# Patient Record
Sex: Male | Born: 1990 | Race: Black or African American | Hispanic: No | Marital: Single | Smoking: Current some day smoker
Health system: Southern US, Community
[De-identification: ages and names within clinical notes are randomized; demographics above are authoritative.]

## PROBLEM LIST (undated history)

## (undated) DIAGNOSIS — S2239XA Fracture of one rib, unspecified side, initial encounter for closed fracture: Secondary | ICD-10-CM

## (undated) DIAGNOSIS — S62609A Fracture of unspecified phalanx of unspecified finger, initial encounter for closed fracture: Secondary | ICD-10-CM

## (undated) DIAGNOSIS — G4700 Insomnia, unspecified: Secondary | ICD-10-CM

## (undated) DIAGNOSIS — S8290XA Unspecified fracture of unspecified lower leg, initial encounter for closed fracture: Secondary | ICD-10-CM

## (undated) HISTORY — PX: FRACTURE SURGERY: SHX138

---

## 2010-03-08 ENCOUNTER — Emergency Department (HOSPITAL_COMMUNITY)
Admission: EM | Admit: 2010-03-08 | Discharge: 2010-03-08 | Payer: Self-pay | Source: Home / Self Care | Admitting: Emergency Medicine

## 2011-06-18 ENCOUNTER — Emergency Department (HOSPITAL_COMMUNITY)
Admission: EM | Admit: 2011-06-18 | Discharge: 2011-06-18 | Disposition: A | Discharge status: Home / Self Care | Payer: Self-pay | Attending: Emergency Medicine | Admitting: Emergency Medicine

## 2011-06-18 DIAGNOSIS — IMO0002 Reserved for concepts with insufficient information to code with codable children: Secondary | ICD-10-CM | POA: Insufficient documentation

## 2011-09-15 ENCOUNTER — Emergency Department (HOSPITAL_COMMUNITY)
Admission: EM | Admit: 2011-09-15 | Discharge: 2011-09-15 | Disposition: A | Discharge status: Home / Self Care | Payer: Self-pay | Attending: Emergency Medicine | Admitting: Emergency Medicine

## 2011-09-15 DIAGNOSIS — R599 Enlarged lymph nodes, unspecified: Secondary | ICD-10-CM | POA: Insufficient documentation

## 2011-09-15 DIAGNOSIS — L988 Other specified disorders of the skin and subcutaneous tissue: Secondary | ICD-10-CM | POA: Insufficient documentation

## 2011-09-15 DIAGNOSIS — R238 Other skin changes: Secondary | ICD-10-CM

## 2011-09-15 MED ORDER — ACYCLOVIR 5 % EX OINT: TOPICAL_OINTMENT | CUTANEOUS | Status: AC

## 2011-09-15 MED ORDER — LIDOCAINE HCL 2 % IJ SOLN
INTRAMUSCULAR | Status: AC
  Filled 2011-09-15: qty 1

## 2011-09-15 MED ORDER — ACYCLOVIR 5 % EX OINT: TOPICAL_OINTMENT | CUTANEOUS | Status: DC

## 2011-09-15 NOTE — ED Provider Notes (Signed)
History     CSN: [id number] Arrival date & time: 09/15/2011  8:49 AM   First MD Initiated Contact with Patient 09/15/11 1029      Chief Complaint  Patient presents with  . Abscess    (Consider location/radiation/quality/duration/timing/severity/associated sxs/prior treatment) HPI Comments: Patient first noticed a rash on his buttocks yesterday.  He reports that he has never had anything like this before.  The rash does not itch or burn.  The rash is tender to palpation.  No history of STD's.  Patient is sexually active, but reports that he always wears a condom.  He denies any anal intercourse.  He is also having some tenderness and has noticed a bump in the right inguinal region.  Patient is a 20 y.o. male presenting with rash. The history is provided by the patient.  Rash  This is a new problem. The current episode started yesterday. The problem has been gradually worsening. The problem is associated with nothing. There has been no fever. The rash is present on the right buttock. He has tried nothing for the symptoms.    History reviewed. No pertinent past medical history.  History reviewed. No pertinent past surgical history.  History reviewed. No pertinent family history.  History  Substance Use Topics  . Smoking status: Current Everyday Smoker  . Smokeless tobacco: Not on file  . Alcohol Use: Yes      Review of Systems  Constitutional: Negative for fever, chills and fatigue.  Gastrointestinal: Negative for nausea, vomiting, abdominal pain, diarrhea and constipation.  Genitourinary: Negative for dysuria, hematuria, decreased urine volume, discharge, penile swelling, scrotal swelling, difficulty urinating, genital sores, penile pain and testicular pain.  Skin: Positive for rash.  Hematological: Positive for adenopathy.    Allergies  Review of patient's allergies indicates no known allergies.  Home Medications  No current outpatient prescriptions on file.  BP  121/69  Pulse 55  Temp(Src) 98.6 F (37 C) (Oral)  Resp 18  SpO2 99%  Physical Exam  Nursing note and vitals reviewed. Constitutional: He appears well-developed and well-nourished.  HENT:  Head: Normocephalic and atraumatic.  Neck: Normal range of motion. Neck supple.  Cardiovascular: Normal rate, regular rhythm and normal heart sounds.   Pulmonary/Chest: Effort normal and breath sounds normal.  Abdominal: Soft. There is no tenderness.  Genitourinary: Testes normal and penis normal. Circumcised. No penile erythema or penile tenderness. No discharge found.       Erythematous vesicular rash on right buttock and the gluteal cleft  Lymphadenopathy:       Right: Inguinal adenopathy present.    ED Course  Procedures (including critical care time)  Labs Reviewed - No data to display No results found.   1. Rash, vesicular    Patient discussed with Dr. Juleen China who also evaluated patient.    MDM  Erythematous Vesicular rash consistent in appearance with Herpes.  However, location of rash unusual for herpes.  Will discharge patient with prescription for Acyclovir and encourage PCP follow up.        Pascal Lux Wingen 09/16/11 0100

## 2011-09-15 NOTE — ED Notes (Signed)
Pt in with several abscess to the rectum area states noted this am states painful when sitting denies drainage from the area

## 2011-09-21 NOTE — ED Provider Notes (Signed)
Medical screening examination/treatment/procedure(s) were performed by non-physician practitioner and as supervising physician I was immediately available for consultation/collaboration.  Mashelle Busick, MD 09/21/11 0524 

## 2014-01-24 ENCOUNTER — Encounter (HOSPITAL_COMMUNITY): Payer: Self-pay | Admitting: Emergency Medicine

## 2014-01-24 ENCOUNTER — Emergency Department (HOSPITAL_COMMUNITY): Payer: Self-pay

## 2014-01-24 ENCOUNTER — Emergency Department (HOSPITAL_COMMUNITY)
Admission: EM | Admit: 2014-01-24 | Discharge: 2014-01-24 | Disposition: A | Discharge status: Home / Self Care | Payer: Self-pay | Attending: Emergency Medicine | Admitting: Emergency Medicine

## 2014-01-24 DIAGNOSIS — F17200 Nicotine dependence, unspecified, uncomplicated: Secondary | ICD-10-CM | POA: Insufficient documentation

## 2014-01-24 DIAGNOSIS — M25579 Pain in unspecified ankle and joints of unspecified foot: Secondary | ICD-10-CM | POA: Insufficient documentation

## 2014-01-24 DIAGNOSIS — Z8781 Personal history of (healed) traumatic fracture: Secondary | ICD-10-CM | POA: Insufficient documentation

## 2014-01-24 DIAGNOSIS — M79671 Pain in right foot: Secondary | ICD-10-CM

## 2014-01-24 HISTORY — DX: Fracture of unspecified phalanx of unspecified finger, initial encounter for closed fracture: S62.609A

## 2014-01-24 HISTORY — DX: Fracture of one rib, unspecified side, initial encounter for closed fracture: S22.39XA

## 2014-01-24 HISTORY — DX: Unspecified fracture of unspecified lower leg, initial encounter for closed fracture: S82.90XA

## 2014-01-24 NOTE — ED Notes (Signed)
Patient c/o right foot pain that he noted this AM. Patient states he is unable to bear weight on his right foot. Patient denies any injuries.

## 2014-01-24 NOTE — ED Provider Notes (Signed)
CSN: [id number]     Arrival date & time 01/24/14  1204 History   First MD Initiated Contact with Patient 01/24/14 1257     Chief Complaint  Patient presents with  . Foot Pain     (Consider location/radiation/quality/duration/timing/severity/associated sxs/prior Treatment) Patient is a 23 y.o. male presenting with lower extremity pain. The history is provided by the patient and medical records.  Foot Pain Associated symptoms include arthralgias.   This is a 23 year old male with no significant past medical history presenting to the ED for right foot pain. Patient states he played basketball over the weekend but does not remember specific injury. States this morning he was able to bear weight but it was very painful so he was limping a little.  Denies numbness or paresthesias of foot. No prior right foot or ankle injuries.  Past Medical History  Diagnosis Date  . Rib fracture   . Finger fracture   . Leg fracture    No past surgical history on file. History reviewed. No pertinent family history. History  Substance Use Topics  . Smoking status: Current Some Day Smoker    Types: Cigarettes, Cigars  . Smokeless tobacco: Not on file  . Alcohol Use: Yes     Comment: weekends only    Review of Systems  Musculoskeletal: Positive for arthralgias.  All other systems reviewed and are negative.     Allergies  Review of patient's allergies indicates no known allergies.  Home Medications   Current Outpatient Rx  Name  Route  Sig  Dispense  Refill  . aspirin-acetaminophen-caffeine (EXCEDRIN MIGRAINE) 250-250-65 MG per tablet   Oral   Take 1 tablet by mouth every 6 (six) hours as needed for headache.          BP 122/60  Pulse 64  Temp(Src) 97.6 F (36.4 C) (Oral)  Resp 16  SpO2 99%  Physical Exam  Nursing note and vitals reviewed. Constitutional: He is oriented to person, place, and time. He appears well-developed and well-nourished. No distress.  HENT:  Head:  Normocephalic and atraumatic.  Mouth/Throat: Oropharynx is clear and moist.  Eyes: Conjunctivae and EOM are normal. Pupils are equal, round, and reactive to light.  Neck: Normal range of motion.  Cardiovascular: Normal rate, regular rhythm and normal heart sounds.   Pulmonary/Chest: Effort normal and breath sounds normal. No respiratory distress. He has no wheezes.  Musculoskeletal: Normal range of motion.       Right foot: He exhibits tenderness. He exhibits no bony tenderness, no crepitus and no deformity.       Feet:  Pain along the lateral aspect of right foot, no swelling or gross deformities present; strong DP pulse and cap refill; sensation intact; moving all toes appropriately  Neurological: He is alert and oriented to person, place, and time.  Skin: Skin is warm. He is not diaphoretic.  Psychiatric: He has a normal mood and affect.    ED Course  ORTHOPEDIC INJURY TREATMENT Date/Time: 01/24/2014 2:41 PM Performed by: Garlon HatchetSANDERS, Aarica Wax M Authorized by: Garlon HatchetSANDERS, Keoni Risinger M Consent: Verbal consent obtained. Risks and benefits: risks, benefits and alternatives were discussed Consent given by: patient Patient understanding: patient states understanding of the procedure being performed Patient identity confirmed: verbally with patient Injury location: foot Location details: right foot Injury type: soft tissue Pre-procedure neurovascular assessment: neurovascularly intact Pre-procedure distal perfusion: normal Pre-procedure neurological function: normal Pre-procedure range of motion: normal Immobilization: brace Supplies used: elastic bandage Post-procedure neurovascular assessment: post-procedure neurovascularly intact Post-procedure distal perfusion:  normal Post-procedure neurological function: normal Post-procedure range of motion: normal   (including critical care time) Labs Review Labs Reviewed - No data to display Imaging Review Dg Foot Complete Right  01/24/2014   CLINICAL  DATA:  Right lateral foot pain  EXAM: RIGHT FOOT COMPLETE - 3+ VIEW  COMPARISON:  None.  FINDINGS: Three views of right foot submitted. No acute fracture or subluxation. No radiopaque foreign body.  IMPRESSION: Negative.   Electronically Signed   By: Natasha MeadLiviu  Pop M.D.   On: 01/24/2014 14:23     EKG Interpretation None      MDM   Final diagnoses:  Foot pain, right   X-ray negative for acute fracture or dislocation. Patient's foot was wrapped with Ace wrap, he states this improved his pain.  May take OTC tylenol/motrin PRN pain.  Advised RICE routine at home for added relief.  Discussed plan with pt, they agreed.  Return precautions advised.  Garlon HatchetLisa M Mikeyla Music, PA-C 01/24/14 1523

## 2014-01-24 NOTE — Discharge Instructions (Signed)
May keep foot wrapped for comfort. Take tylenol or motrin as needed for pain.  May elevate and ice for added relief. Return to the ED for new concerns.

## 2014-01-24 NOTE — ED Provider Notes (Signed)
Medical screening examination/treatment/procedure(s) were performed by non-physician practitioner and as supervising physician I was immediately available for consultation/collaboration.   EKG Interpretation None      Rosaleah Person, MD, FACEP   Bernita Beckstrom L Kenzey Birkland, MD 01/24/14 1641 

## 2014-01-24 NOTE — Progress Notes (Signed)
[facility] provided pt with a list of primary care resources. Patient stated that he was pending insurance through job.  °

## 2016-06-29 ENCOUNTER — Emergency Department (HOSPITAL_COMMUNITY): Payer: No Typology Code available for payment source

## 2016-06-29 ENCOUNTER — Encounter (HOSPITAL_COMMUNITY): Payer: Self-pay | Admitting: *Deleted

## 2016-06-29 ENCOUNTER — Emergency Department (HOSPITAL_COMMUNITY)
Admission: EM | Admit: 2016-06-29 | Discharge: 2016-06-30 | Disposition: A | Discharge status: Home / Self Care | Payer: No Typology Code available for payment source | Attending: Emergency Medicine | Admitting: Emergency Medicine

## 2016-06-29 DIAGNOSIS — F17210 Nicotine dependence, cigarettes, uncomplicated: Secondary | ICD-10-CM | POA: Diagnosis not present

## 2016-06-29 DIAGNOSIS — R51 Headache: Secondary | ICD-10-CM | POA: Insufficient documentation

## 2016-06-29 DIAGNOSIS — Z79899 Other long term (current) drug therapy: Secondary | ICD-10-CM | POA: Diagnosis not present

## 2016-06-29 DIAGNOSIS — Y9389 Activity, other specified: Secondary | ICD-10-CM | POA: Diagnosis not present

## 2016-06-29 DIAGNOSIS — Y92410 Unspecified street and highway as the place of occurrence of the external cause: Secondary | ICD-10-CM | POA: Diagnosis not present

## 2016-06-29 DIAGNOSIS — M542 Cervicalgia: Secondary | ICD-10-CM | POA: Diagnosis not present

## 2016-06-29 DIAGNOSIS — Y999 Unspecified external cause status: Secondary | ICD-10-CM | POA: Diagnosis not present

## 2016-06-29 HISTORY — DX: Insomnia, unspecified: G47.00

## 2016-06-29 MED ORDER — CYCLOBENZAPRINE HCL 10 MG PO TABS
5.0000 mg | ORAL_TABLET | Freq: Once | ORAL | Status: AC
  Administered 2016-06-30: 5 mg via ORAL
  Filled 2016-06-29: qty 1

## 2016-06-29 MED ORDER — HYDROCODONE-ACETAMINOPHEN 5-325 MG PO TABS
1.0000 | ORAL_TABLET | Freq: Once | ORAL | Status: AC
  Administered 2016-06-30: 1 via ORAL
  Filled 2016-06-29: qty 1

## 2016-06-29 NOTE — ED Notes (Signed)
Bed: [id number] Expected date:  Expected time:  Means of arrival:  Comments: rm 29
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
## 2016-06-30 MED ORDER — CYCLOBENZAPRINE HCL 5 MG PO TABS: 5.0000 mg | ORAL_TABLET | Freq: Three times a day (TID) | ORAL | 0 refills | Status: AC | PRN

## 2016-06-30 NOTE — Discharge Instructions (Signed)
Read the information below.   Your imaging was re-assuring. You may have muscle soreness over the next 2-3 days. You can take tylenol or motrin for pain relief. I have prescribed flexeril, a muscle relaxer, for relief. This can make you drowsy, do not drive after taking. You can apply ice to affected muscles. You can also try warm showers for aching muscles.  Use the prescribed medication as directed.  Please discuss all new medications with your pharmacist.   It is important that you establish a primary care doctor, I have provided the contact information above for [facility].  You may return to the Emergency Department at any time for worsening condition or any new symptoms that concern you. Return to ED if you develop worsening symptoms, vomiting, blood in urine, or focal neurologic changes - agitation, numbness, weakness, facial droop, or slurred speech.
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
## 2018-07-08 ENCOUNTER — Other Ambulatory Visit: Payer: Self-pay

## 2018-07-08 ENCOUNTER — Encounter (HOSPITAL_COMMUNITY): Payer: Self-pay | Admitting: Emergency Medicine

## 2018-07-08 ENCOUNTER — Emergency Department (HOSPITAL_COMMUNITY)
Admission: EM | Admit: 2018-07-08 | Discharge: 2018-07-08 | Disposition: A | Discharge status: Home / Self Care | Payer: Self-pay | Attending: Emergency Medicine | Admitting: Emergency Medicine

## 2018-07-08 DIAGNOSIS — W540XXA Bitten by dog, initial encounter: Secondary | ICD-10-CM | POA: Insufficient documentation

## 2018-07-08 DIAGNOSIS — Z23 Encounter for immunization: Secondary | ICD-10-CM | POA: Insufficient documentation

## 2018-07-08 DIAGNOSIS — Z2914 Encounter for prophylactic rabies immune globin: Secondary | ICD-10-CM | POA: Insufficient documentation

## 2018-07-08 DIAGNOSIS — S61451A Open bite of right hand, initial encounter: Secondary | ICD-10-CM | POA: Insufficient documentation

## 2018-07-08 DIAGNOSIS — Y92524 Gas station as the place of occurrence of the external cause: Secondary | ICD-10-CM | POA: Insufficient documentation

## 2018-07-08 DIAGNOSIS — Y9389 Activity, other specified: Secondary | ICD-10-CM | POA: Insufficient documentation

## 2018-07-08 DIAGNOSIS — F17210 Nicotine dependence, cigarettes, uncomplicated: Secondary | ICD-10-CM | POA: Insufficient documentation

## 2018-07-08 DIAGNOSIS — Y999 Unspecified external cause status: Secondary | ICD-10-CM | POA: Insufficient documentation

## 2018-07-08 MED ORDER — TETANUS-DIPHTH-ACELL PERTUSSIS 5-2.5-18.5 LF-MCG/0.5 IM SUSP
0.5000 mL | Freq: Once | INTRAMUSCULAR | Status: AC
  Administered 2018-07-08: 0.5 mL via INTRAMUSCULAR
  Filled 2018-07-08: qty 0.5

## 2018-07-08 MED ORDER — AMOXICILLIN-POT CLAVULANATE 875-125 MG PO TABS: 1.0000 | ORAL_TABLET | Freq: Two times a day (BID) | ORAL | 0 refills | Status: AC

## 2018-07-08 MED ORDER — RABIES IMMUNE GLOBULIN 150 UNIT/ML IM INJ
20.0000 [IU]/kg | INJECTION | Freq: Once | INTRAMUSCULAR | Status: AC
  Administered 2018-07-08: 2175 [IU] via INTRAMUSCULAR
  Filled 2018-07-08: qty 14.5

## 2018-07-08 MED ORDER — RABIES VACCINE, PCEC IM SUSR
1.0000 mL | Freq: Once | INTRAMUSCULAR | Status: AC
  Administered 2018-07-08: 1 mL via INTRAMUSCULAR
  Filled 2018-07-08: qty 1

## 2018-07-08 MED ORDER — AMOXICILLIN-POT CLAVULANATE 875-125 MG PO TABS
1.0000 | ORAL_TABLET | Freq: Once | ORAL | Status: AC
  Administered 2018-07-08: 1 via ORAL
  Filled 2018-07-08: qty 1

## 2018-07-08 NOTE — ED Provider Notes (Signed)
[facility] EMERGENCY DEPARTMENT Provider Note   CSN: [id number] Arrival date & time: 07/08/18  0003     History   Chief Complaint Chief Complaint  Patient presents with  . Animal Bite    HPI Ian Harris is a 27 y.o. male.  Patient present with right hand injury after dog bite earlier by a dog that is unknown to the patient. The dog approached the patient who was with his dog at a gas station and when the patient tried to prevent the dog from getting close he was bitten on his hand. No other injury. Tetanus status unknown.   The history is provided by the patient. No language interpreter was used.  Animal Bite    Past Medical History:  Diagnosis Date  . Finger fracture   . Insomnia   . Leg fracture   . Rib fracture     There are no active problems to display for this patient.   History reviewed. No pertinent surgical history.      Home Medications    Prior to Admission medications   Medication Sig Start Date End Date Taking? Authorizing Provider  aspirin-acetaminophen-caffeine (EXCEDRIN MIGRAINE) [phone] MG per tablet Take 1 tablet by mouth every 6 (six) hours as needed for headache.    [provider]  cyclobenzaprine (FLEXERIL) 5 MG tablet Take 1 tablet (5 mg total) by mouth 3 (three) times daily as needed for muscle spasms. 06/30/16   Deborha Payment, PA-C    Family History No family history on file.  Social History Social History   Tobacco Use  . Smoking status: Current Some Day Smoker    Types: Cigarettes, Cigars  . Smokeless tobacco: Never Used  Substance Use Topics  . Alcohol use: Yes    Comment: weekends only  . Drug use: Yes    Types: Marijuana     Allergies   Patient has no known allergies.   Review of Systems Review of Systems  Musculoskeletal:       See HPI.  Skin: Positive for wound.     Physical Exam Updated Vital Signs BP (!) 147/85 (BP Location: Left Arm)   Pulse 60   Temp 99.2 F (37.3 C)  (Oral)   Resp 14   SpO2 99%   Physical Exam  Constitutional: He is oriented to person, place, and time. He appears well-developed and well-nourished.  Neck: Normal range of motion.  Pulmonary/Chest: Effort normal.  Musculoskeletal: Normal range of motion.  FROM right wrist and all digits.   Neurological: He is alert and oriented to person, place, and time.  Skin: Skin is warm and dry.  1 cm linear, full thickness laceration to thenar right hand. FROM thumb. No significant swelling. No dorsal wound.   Psychiatric: He has a normal mood and affect.     ED Treatments / Results  Labs (all labs ordered are listed, but only abnormal results are displayed) Labs Reviewed - No data to display  EKG None  Radiology No results found.  Procedures Procedures (including critical care time)  Medications Ordered in ED Medications  Tdap (BOOSTRIX) injection 0.5 mL (has no administration in time range)  amoxicillin-clavulanate (AUGMENTIN) 875-125 MG per tablet 1 tablet (has no administration in time range)  rabies vaccine (RABAVERT) injection 1 mL (has no administration in time range)     Initial Impression / Assessment and Plan / ED Course  I have reviewed the triage vital signs and the nursing notes.  Pertinent labs &  imaging results that were available during my care of the patient were reviewed by me and considered in my medical decision making (see chart for details).     Patient will be started on rabies for preventative treatment of bite by unknown animal. Will start Augmentin, update tetanus. Wound cleaned and bandaged. No wound repair.   Schedule of return to Short Stay provided to the patient on discharge as well as discussed during patient care.   Final Clinical Impressions(s) / ED Diagnoses   Final diagnoses:  None   1. Dog bite   ED Discharge Orders    None       Elpidio Anis, Cordelia Poche 07/08/18 1610    Nira Conn, MD 07/08/18 [phone]

## 2018-07-08 NOTE — Discharge Instructions (Signed)
Go to Short Stay for subsequent rabies shots:  3/28, 10/2, 10/9 Return to the ED with any sign of infection Take Augmentin as prescribed Ibuprofen for pain as needed.

## 2018-07-08 NOTE — ED Notes (Signed)
Patient Alert and oriented to baseline. Stable and ambulatory to baseline. Patient verbalized understanding of the discharge instructions.  Patient belongings were taken by the patient.   

## 2018-07-08 NOTE — ED Triage Notes (Signed)
Pt was at a gas station with his puppy when another dog came up to his puppy and he tried to get the dog back.  Dog bit pt's R hand. C/o approx 1-2 cm laceration to R hand/below thumb x 45 min.  Unknown dog.

## 2019-07-08 ENCOUNTER — Encounter (HOSPITAL_COMMUNITY): Payer: Self-pay

## 2019-07-08 ENCOUNTER — Other Ambulatory Visit: Payer: Self-pay

## 2019-07-08 ENCOUNTER — Emergency Department (HOSPITAL_COMMUNITY): Payer: Self-pay

## 2019-07-08 ENCOUNTER — Emergency Department (HOSPITAL_COMMUNITY)
Admission: EM | Admit: 2019-07-08 | Discharge: 2019-07-08 | Disposition: A | Discharge status: Home / Self Care | Payer: Self-pay | Attending: Emergency Medicine | Admitting: Emergency Medicine

## 2019-07-08 DIAGNOSIS — F1210 Cannabis abuse, uncomplicated: Secondary | ICD-10-CM | POA: Insufficient documentation

## 2019-07-08 DIAGNOSIS — M659 Synovitis and tenosynovitis, unspecified: Secondary | ICD-10-CM | POA: Insufficient documentation

## 2019-07-08 DIAGNOSIS — F17210 Nicotine dependence, cigarettes, uncomplicated: Secondary | ICD-10-CM | POA: Insufficient documentation

## 2019-07-08 MED ORDER — IBUPROFEN 800 MG PO TABS
800.0000 mg | ORAL_TABLET | Freq: Once | ORAL | Status: AC
  Administered 2019-07-08: 800 mg via ORAL
  Filled 2019-07-08: qty 1

## 2019-07-08 NOTE — Discharge Instructions (Signed)
As discussed, you have been diagnosed with tenosynovitis. This is an inflammatory condition of your wrist and thumb. Please keep the provided brace in place, as much as possible for the next 5 days. You may then use it in reduced amounts, as needed. Please take ibuprofen, 800 mg, 3 times daily for the next 5 days, then taper as needed. If you develop new, or concerning changes return here or follow-up with our hand surgery specialist.

## 2019-07-08 NOTE — ED Notes (Signed)
Opened chart when pt called clarify d/c instructions

## 2019-07-08 NOTE — ED Provider Notes (Signed)
[facility]-EMERGENCY DEPT Provider Note   CSN: [id number] Arrival date & time: 07/08/19  1219     History   Chief Complaint Chief Complaint  Patient presents with  . hand swelling    HPI Ian Harris is a 28 y.o. male.     HPI Patient presents with concern of pain and swelling in his left wrist and thumb. Onset was about 5 days ago, atraumatic.  The patient works in a [employer], performed both office and Music therapist.  Patient is generally well, was well prior to the onset of symptoms. He denies medical problems, takes no medication regularly, does smoke cigarettes. Since onset he has been using a compression splint with mild change in the severity of his pain.   Past Medical History:  Diagnosis Date  . Finger fracture   . Insomnia   . Leg fracture   . Rib fracture     There are no active problems to display for this patient.   Past Surgical History:  Procedure Laterality Date  . FRACTURE SURGERY          Home Medications    Prior to Admission medications   Medication Sig Start Date End Date Taking? Authorizing Provider  aspirin-acetaminophen-caffeine (EXCEDRIN MIGRAINE) [phone] MG per tablet Take 1 tablet by mouth every 6 (six) hours as needed for headache.    [provider]  cyclobenzaprine (FLEXERIL) 5 MG tablet Take 1 tablet (5 mg total) by mouth 3 (three) times daily as needed for muscle spasms. 06/30/16   Deborha Payment, PA-C    Family History History reviewed. No pertinent family history.  Social History Social History   Tobacco Use  . Smoking status: Current Some Day Smoker    Types: Cigarettes, Cigars  . Smokeless tobacco: Never Used  Substance Use Topics  . Alcohol use: Not Currently  . Drug use: Yes    Types: Marijuana     Allergies   Patient has no known allergies.   Review of Systems Review of Systems  Constitutional:       Per HPI, otherwise negative  HENT:       Per HPI, otherwise  negative  Respiratory:       Per HPI, otherwise negative  Cardiovascular:       Per HPI, otherwise negative  Gastrointestinal: Negative for vomiting.  Endocrine:       Negative aside from HPI  Genitourinary:       Neg aside from HPI   Musculoskeletal:       Per HPI, otherwise negative  Skin: Negative.   Neurological: Negative for syncope.     Physical Exam Updated Vital Signs BP 136/81 (BP Location: Right Arm)   Pulse 69   Temp 98.1 F (36.7 C) (Oral)   Resp 16   Ht 6' (1.829 m)   Wt 104.3 kg   SpO2 98%   BMI 31.19 kg/m   Physical Exam Vitals signs and nursing note reviewed.  Constitutional:      General: He is not in acute distress.    Appearance: He is well-developed.  HENT:     Head: Normocephalic and atraumatic.  Eyes:     Conjunctiva/sclera: Conjunctivae normal.  Cardiovascular:     Rate and Rhythm: Normal rate and regular rhythm.     Pulses: Normal pulses.  Pulmonary:     Effort: Pulmonary effort is normal. No respiratory distress.     Breath sounds: No stridor.  Abdominal:     General: There  is no distension.  Musculoskeletal:       Arms:  Skin:    General: Skin is warm and dry.  Neurological:     Mental Status: He is alert and oriented to person, place, and time.      ED Treatments / Results   Procedures Procedures (including critical care time)  Medications Ordered in ED Medications  ibuprofen (ADVIL) tablet 800 mg (has no administration in time range)     Initial Impression / Assessment and Plan / ED Course  I have reviewed the triage vital signs and the nursing notes.  Pertinent labs & imaging results that were available during my care of the patient were reviewed by me and considered in my medical decision making (see chart for details).  Previously well young male presents with new left wrist and thumb pain consistent with de Quervain's tenosynovitis. Patient is otherwise well, was placed in thumb splint, started on  anti-inflammatories, discharged with return precautions and follow-up instructions.  Final Clinical Impressions(s) / ED Diagnoses   Final diagnoses:  Tenosynovitis     Carmin Muskrat, MD 07/08/19 1315

## 2019-07-08 NOTE — ED Triage Notes (Signed)
Patient c/o left hand swelling and pain x 4 days. Patient denies any injury to his left hand.

## 2019-12-10 ENCOUNTER — Ambulatory Visit: Payer: Self-pay | Attending: Internal Medicine

## 2019-12-10 DIAGNOSIS — Z20822 Contact with and (suspected) exposure to covid-19: Secondary | ICD-10-CM | POA: Insufficient documentation

## 2019-12-11 LAB — NOVEL CORONAVIRUS, NAA: SARS-CoV-2, NAA: NOT DETECTED

## 2020-03-18 ENCOUNTER — Ambulatory Visit: Payer: Self-pay | Attending: Internal Medicine

## 2020-03-18 DIAGNOSIS — Z23 Encounter for immunization: Secondary | ICD-10-CM

## 2020-03-18 NOTE — Progress Notes (Signed)
   Covid-19 Vaccination Clinic  Name:  Nolin Grell    MRN: [id number] DOB: 07-19-1991  03/18/2020  Mr. Lieb was observed post Covid-19 immunization for 15 minutes without incident. He was provided with Vaccine Information Sheet and instruction to access the V-Safe system.   Mr. Fluke was instructed to call 911 with any severe reactions post vaccine: Marland Kitchen Difficulty breathing  . Swelling of face and throat  . A fast heartbeat  . A bad rash all over body  . Dizziness and weakness   Immunizations Administered    Name Date Dose VIS Date Route   Pfizer COVID-19 Vaccine 03/18/2020 11:44 AM 0.3 mL 12/08/2018 Intramuscular   Manufacturer: [employer], [employer]   Lot: [id number]   NDC: [address]-4

## 2020-04-10 ENCOUNTER — Ambulatory Visit: Payer: Self-pay | Attending: Internal Medicine

## 2020-04-10 DIAGNOSIS — Z23 Encounter for immunization: Secondary | ICD-10-CM

## 2020-04-10 NOTE — Progress Notes (Signed)
   Covid-19 Vaccination Clinic  Name:  Ian Harris    MRN: [id number] DOB: 1991-02-16  04/10/2020  Mr. Ian Harris was observed post Covid-19 immunization for 15 minutes without incident. He was provided with Vaccine Information Sheet and instruction to access the V-Safe system.   Mr. Ian Harris was instructed to call 911 with any severe reactions post vaccine: Marland Kitchen Difficulty breathing  . Swelling of face and throat  . A fast heartbeat  . A bad rash all over body  . Dizziness and weakness   Immunizations Administered    Name Date Dose VIS Date Route   Pfizer COVID-19 Vaccine 04/10/2020  4:54 PM 0.3 mL 12/08/2018 Intramuscular   Manufacturer: [employer], [employer]   Lot: [id number]   NDC: [address]-3

## 2020-07-19 ENCOUNTER — Ambulatory Visit (INDEPENDENT_AMBULATORY_CARE_PROVIDER_SITE_OTHER): Payer: No Typology Code available for payment source | Admitting: Podiatry

## 2020-07-19 ENCOUNTER — Other Ambulatory Visit: Payer: Self-pay

## 2020-07-19 DIAGNOSIS — Z79899 Other long term (current) drug therapy: Secondary | ICD-10-CM | POA: Diagnosis not present

## 2020-07-19 DIAGNOSIS — B351 Tinea unguium: Secondary | ICD-10-CM | POA: Diagnosis not present

## 2020-07-20 ENCOUNTER — Encounter: Payer: Self-pay | Admitting: Podiatry

## 2020-07-20 NOTE — Progress Notes (Signed)
  Subjective:  Patient ID: Ian Harris, male    DOB: 05-Apr-1991,  MRN: [id number]  Chief Complaint  Patient presents with  . Nail Problem    Bilateral nail fungus PT nails are thick and discolored     29 y.o. male presents with the above complaint.  Patient presents with complaint of thickened elongated dystrophic toenails x10.  Patient states that he would like to get them treated especially bilateral hallux.  Patient states there is mild pain to touch.  Has been going on for past 3 years.  As progressing on worse.  He would like to now see if there is any treatment options are available to him.  He denies any other acute complaints he has not seen anyone else prior to seeing me.  He has not tried any other treatment options   Review of Systems: Negative except as noted in the HPI. Denies N/V/F/Ch.  Past Medical History:  Diagnosis Date  . Finger fracture   . Insomnia   . Leg fracture   . Rib fracture     Current Outpatient Medications:  .  aspirin-acetaminophen-caffeine (EXCEDRIN MIGRAINE) [phone] MG per tablet, Take 1 tablet by mouth every 6 (six) hours as needed for headache., Disp: , Rfl:  .  cyclobenzaprine (FLEXERIL) 5 MG tablet, Take 1 tablet (5 mg total) by mouth 3 (three) times daily as needed for muscle spasms., Disp: 15 tablet, Rfl: 0 .  penicillin v potassium (VEETID) 500 MG tablet, Take 500 mg by mouth 4 (four) times daily., Disp: , Rfl:   Social History   Tobacco Use  Smoking Status Current Some Day Smoker  . Types: Cigarettes, Cigars  Smokeless Tobacco Never Used    No Known Allergies Objective:  There were no vitals filed for this visit. There is no height or weight on file to calculate BMI. Constitutional Well developed. Well nourished.  Vascular Dorsalis pedis pulses palpable bilaterally. Posterior tibial pulses palpable bilaterally. Capillary refill normal to all digits.  No cyanosis or clubbing noted. Pedal hair growth normal.  Neurologic Normal  speech. Oriented to person, place, and time. Epicritic sensation to light touch grossly present bilaterally.  Dermatologic  thickened elongated dystrophic toenails x10 with worsening to bilateral hallux.  There is presence of microtrauma as well.  Mild pain on palpation  Orthopedic: Normal joint ROM without pain or crepitus bilaterally. No visible deformities. No bony tenderness.   Radiographs: None Assessment:   1. Long-term use of high-risk medication   2. Onychomycosis due to dermatophyte    Plan:  Patient was evaluated and treated and all questions answered.  Bilateral toenail fungus x10 -Educated the patient on the etiology of onychomycosis and various treatment options associated with improving the fungal load.  I explained to the patient that there is 3 treatment options available to treat the onychomycosis including topical, p.o., laser treatment.  Patient elected to undergo p.o. options with Lamisil/terbinafine therapy.  In order for me to start the medication therapy, I explained to the patient the importance of evaluating the liver and obtaining the liver function test.  Once the liver function test comes back normal I will start him on 31-month course of Lamisil therapy.  Patient understood all risk and would like to proceed with Lamisil therapy.  I have asked the patient to immediately stop the Lamisil therapy if she has any reactions to it and call the office or go to the emergency room right away.  Patient states understanding   No follow-ups on file.

## 2023-01-24 ENCOUNTER — Emergency Department (HOSPITAL_COMMUNITY)
Admission: EM | Admit: 2023-01-24 | Discharge: 2023-01-25 | Disposition: A | Discharge status: Home / Self Care | Payer: Self-pay | Attending: Emergency Medicine | Admitting: Emergency Medicine

## 2023-01-24 ENCOUNTER — Encounter (HOSPITAL_COMMUNITY): Payer: Self-pay | Admitting: Emergency Medicine

## 2023-01-24 DIAGNOSIS — H1089 Other conjunctivitis: Secondary | ICD-10-CM | POA: Insufficient documentation

## 2023-01-24 DIAGNOSIS — H109 Unspecified conjunctivitis: Secondary | ICD-10-CM

## 2023-01-24 MED ORDER — ERYTHROMYCIN 5 MG/GM OP OINT: TOPICAL_OINTMENT | OPHTHALMIC | 0 refills | Status: AC

## 2023-01-24 MED ORDER — POLYMYXIN B-TRIMETHOPRIM 10000-0.1 UNIT/ML-% OP SOLN: 1.0000 [drp] | OPHTHALMIC | 1 refills | Status: AC

## 2023-01-24 NOTE — ED Triage Notes (Signed)
Drainage to both eyes for several hours. Both pink. Crusted shut. Vision is blurry from tears.

## 2023-01-24 NOTE — Discharge Instructions (Signed)
You came to the emergency department today with redness and discharge from your right eye.  You have been diagnosed with bacterial conjunctivitis.  There is information about this attached to the discharge papers.  I have sent ointment and drops to your pharmacy.  Please do not use both of these, only pick up whichever 1 is more convenient for cheaper.  Do not hesitate to return with any worsening symptoms however you may also be seen at an urgent care by her PCP.  It was a pleasure to meet you and we hope you feel better!

## 2023-01-24 NOTE — ED Provider Notes (Signed)
[place] EMERGENCY DEPARTMENT AT [facility] Provider Note   CSN: [id number] Arrival date & time: 01/24/23  2038     History  Chief Complaint  Patient presents with   Conjunctivitis    Pradyun Ishman is a 32 y.o. male presenting today with a red right eye with thick discharge.  He woke up and his eye was crusted shut 4 hours ago.  No contact lenses or glasses   Conjunctivitis       Home Medications Prior to Admission medications   Medication Sig Start Date End Date Taking? Authorizing Provider  erythromycin ophthalmic ointment Place a 1/2 inch ribbon of ointment into the lower eyelid 4 times a day for 7 days. 01/24/23  Yes Aniah Pauli A, PA-C  trimethoprim-polymyxin b (POLYTRIM) ophthalmic solution Place 1 drop into the right eye every 4 (four) hours. 01/24/23  Yes Shelah Heatley A, PA-C  aspirin-acetaminophen-caffeine (EXCEDRIN MIGRAINE) [phone] MG per tablet Take 1 tablet by mouth every 6 (six) hours as needed for headache.    [provider]  cyclobenzaprine (FLEXERIL) 5 MG tablet Take 1 tablet (5 mg total) by mouth 3 (three) times daily as needed for muscle spasms. 06/30/16   Deborha Payment, PA-C  penicillin v potassium (VEETID) 500 MG tablet Take 500 mg by mouth 4 (four) times daily. 07/18/20   [provider]      Allergies    Patient has no known allergies.    Review of Systems   Review of Systems  Physical Exam Updated Vital Signs BP (!) 140/79 (BP Location: Right Arm)   Pulse 83   Temp 97.8 F (36.6 C)   Resp 18   SpO2 98%  Physical Exam Vitals and nursing note reviewed.  Constitutional:      Appearance: Normal appearance.  HENT:     Head: Normocephalic and atraumatic.  Eyes:     General: No scleral icterus.    Conjunctiva/sclera: Conjunctivae normal.     Comments: EOMs intact.  PERRLA.  Some watery tears on the patient's face however has a large amounts of crusting and thick discharge coming from the eye.  No visual  disturbance  Pulmonary:     Effort: Pulmonary effort is normal. No respiratory distress.  Skin:    Findings: No rash.  Neurological:     Mental Status: He is alert.  Psychiatric:        Mood and Affect: Mood normal.     ED Results / Procedures / Treatments   Labs (all labs ordered are listed, but only abnormal results are displayed) Labs Reviewed - No data to display  EKG None  Radiology No results found.  Procedures Procedures    Medications Ordered in ED Medications - No data to display  ED Course/ Medical Decision Making/ A&P                             Medical Decision Making Risk Prescription drug management.   32 year old male presenting today with right eye discomfort.  Physical exam and history consistent with bacterial conjunctivitis.  He has been given erythromycin ointment and will be discharged from the triage area.   Final Clinical Impression(s) / ED Diagnoses Final diagnoses:  Bacterial conjunctivitis    Rx / DC Orders ED Discharge Orders          Ordered    erythromycin ophthalmic ointment        01/24/23 2105  trimethoprim-polymyxin b (POLYTRIM) ophthalmic solution  Every 4 hours        01/24/23 2105           Results and diagnoses were explained to the patient. Return precautions discussed in full. Patient had no additional questions and expressed complete understanding.   This chart was dictated using voice recognition software.  Despite best efforts to proofread,  errors can occur which can change the documentation meaning.    Saddie Benders, PA-C 01/24/23 2108    Virgina Norfolk, DO 01/25/23 [phone]
